# Patient Record
Sex: Male | Born: 1995 | Race: White | Hispanic: Yes | Marital: Single | Smoking: Never smoker
Health system: Southern US, Community
[De-identification: ages and names within clinical notes are randomized; demographics above are authoritative.]

## PROBLEM LIST (undated history)

## (undated) DIAGNOSIS — T7840XA Allergy, unspecified, initial encounter: Secondary | ICD-10-CM

## (undated) HISTORY — DX: Allergy, unspecified, initial encounter: T78.40XA

---

## 2016-09-17 ENCOUNTER — Emergency Department (HOSPITAL_BASED_OUTPATIENT_CLINIC_OR_DEPARTMENT_OTHER)
Admission: EM | Admit: 2016-09-17 | Discharge: 2016-09-17 | Disposition: A | Discharge status: Home / Self Care | Payer: Self-pay | Attending: Emergency Medicine | Admitting: Emergency Medicine

## 2016-09-17 ENCOUNTER — Encounter (HOSPITAL_BASED_OUTPATIENT_CLINIC_OR_DEPARTMENT_OTHER): Payer: Self-pay | Admitting: *Deleted

## 2016-09-17 DIAGNOSIS — R112 Nausea with vomiting, unspecified: Secondary | ICD-10-CM | POA: Insufficient documentation

## 2016-09-17 DIAGNOSIS — R197 Diarrhea, unspecified: Secondary | ICD-10-CM | POA: Insufficient documentation

## 2016-09-17 LAB — URINALYSIS, ROUTINE W REFLEX MICROSCOPIC
GLUCOSE, UA: NEGATIVE mg/dL
HGB URINE DIPSTICK: NEGATIVE
Ketones, ur: NEGATIVE mg/dL
LEUKOCYTES UA: NEGATIVE
Nitrite: NEGATIVE
Protein, ur: NEGATIVE mg/dL
SPECIFIC GRAVITY, URINE: 1.03 (ref 1.005–1.030)
pH: 7.5 (ref 5.0–8.0)

## 2016-09-17 LAB — COMPREHENSIVE METABOLIC PANEL
ALT: 17 U/L (ref 17–63)
ANION GAP: 9 (ref 5–15)
AST: 22 U/L (ref 15–41)
Albumin: 4.7 g/dL (ref 3.5–5.0)
Alkaline Phosphatase: 74 U/L (ref 38–126)
BUN: 20 mg/dL (ref 6–20)
CHLORIDE: 101 mmol/L (ref 101–111)
CO2: 30 mmol/L (ref 22–32)
Calcium: 9.8 mg/dL (ref 8.9–10.3)
Creatinine, Ser: 1.08 mg/dL (ref 0.61–1.24)
GFR calc non Af Amer: >60 mL/min (ref >60)
Glucose, Bld: 108 mg/dL — ABNORMAL HIGH (ref 65–99)
Potassium: 3.9 mmol/L (ref 3.5–5.1)
SODIUM: 140 mmol/L (ref 135–145)
Total Bilirubin: 0.8 mg/dL (ref 0.3–1.2)
Total Protein: 8.4 g/dL — ABNORMAL HIGH (ref 6.5–8.1)

## 2016-09-17 LAB — CBC
HCT: 48.2 % (ref 39.0–52.0)
Hemoglobin: 17.7 g/dL — ABNORMAL HIGH (ref 13.0–17.0)
MCH: 29.5 pg (ref 26.0–34.0)
MCHC: 36.7 g/dL — ABNORMAL HIGH (ref 30.0–36.0)
MCV: 80.3 fL (ref 78.0–100.0)
Platelets: 265 10*3/uL (ref 150–400)
RBC: 6 MIL/uL — AB (ref 4.22–5.81)
RDW: 14.3 % (ref 11.5–15.5)
WBC: 14.3 10*3/uL — ABNORMAL HIGH (ref 4.0–10.5)

## 2016-09-17 LAB — LIPASE, BLOOD: LIPASE: 24 U/L (ref 11–51)

## 2016-09-17 MED ORDER — MORPHINE SULFATE (PF) 4 MG/ML IV SOLN
4.0000 mg | Freq: Once | INTRAVENOUS | Status: AC
  Administered 2016-09-17: 4 mg via INTRAVENOUS
  Filled 2016-09-17: qty 1

## 2016-09-17 MED ORDER — ONDANSETRON HCL 4 MG/2ML IJ SOLN
4.0000 mg | Freq: Once | INTRAMUSCULAR | Status: AC
  Administered 2016-09-17: 4 mg via INTRAVENOUS
  Filled 2016-09-17: qty 2

## 2016-09-17 MED ORDER — SODIUM CHLORIDE 0.9 % IV BOLUS (SEPSIS)
1000.0000 mL | Freq: Once | INTRAVENOUS | Status: AC
  Administered 2016-09-17: 1000 mL via INTRAVENOUS

## 2016-09-17 MED ORDER — ONDANSETRON HCL 4 MG PO TABS: 4.0000 mg | ORAL_TABLET | Freq: Three times a day (TID) | ORAL | 0 refills | Status: DC | PRN

## 2016-09-17 NOTE — ED Provider Notes (Signed)
[facility] Provider [facility]   CSN: [id number] Arrival date & time: 09/17/16  1236     History   Chief Complaint Chief Complaint  Patient presents with  . Emesis    HPI Karl Lopez is a 21 y.o. male.  Patient presents emergency department with chief complaint of nausea, vomiting, diarrhea. He states that the symptoms started last night. He denies any associated fevers or chills. He reports having had exposure to sick contacts. He has not taken anything for symptoms. He denies ever having had abdominal surgery. He denies any dysuria. There are no other associated symptoms or modifying factors.   The history is provided by the patient. The history is limited by a language barrier. A language interpreter was used.    History reviewed. No pertinent past medical history.  There are no active problems to display for this patient.   History reviewed. No pertinent surgical history.     Home Medications    Prior to Admission medications   Not on File    Family History History reviewed. No pertinent family history.  Social History Social History  Substance Use Topics  . Smoking status: Never Smoker  . Smokeless tobacco: Never Used  . Alcohol use No     Allergies   Patient has no allergy information on record.   Review of Systems Review of Systems  All other systems reviewed and are negative.    Physical Exam Updated Vital Signs BP 112/77 (BP Location: Right Arm)   Pulse 120   Temp 99.4 F (37.4 C) (Oral)   Resp 18   Ht 5\' 2"  (1.575 m)   Wt 59 kg   SpO2 96%   BMI 23.78 kg/m   Physical Exam  Constitutional: He is oriented to person, place, and time. He appears well-developed and well-nourished.  HENT:  Head: Normocephalic and atraumatic.  Eyes: Conjunctivae and EOM are normal. Pupils are equal, round, and reactive to light. Right eye exhibits no discharge. Left eye exhibits no discharge. No scleral icterus.  Neck: Normal range of motion.  Neck supple. No JVD present.  Cardiovascular: Normal rate, regular rhythm and normal heart sounds.  Exam reveals no gallop and no friction rub.   No murmur heard. Pulmonary/Chest: Effort normal and breath sounds normal. No respiratory distress. He has no wheezes. He has no rales. He exhibits no tenderness.  Abdominal: Soft. He exhibits no distension and no mass. There is no tenderness. There is no rebound and no guarding.  No focal abdominal tenderness, no RLQ tenderness or pain at McBurney's point, no RUQ tenderness or Murphy's sign, no left-sided abdominal tenderness, no fluid wave, or signs of peritonitis   Musculoskeletal: Normal range of motion. He exhibits no edema or tenderness.  Neurological: He is alert and oriented to person, place, and time.  Skin: Skin is warm and dry.  Psychiatric: He has a normal mood and affect. His behavior is normal. Judgment and thought content normal.  Nursing note and vitals reviewed.    ED Treatments / Results  Labs (all labs ordered are listed, but only abnormal results are displayed) Labs Reviewed  URINALYSIS, ROUTINE W REFLEX MICROSCOPIC - Abnormal; Notable for the following:       Result Value   Bilirubin Urine SMALL (*)    All other components within normal limits  LIPASE, BLOOD  COMPREHENSIVE METABOLIC PANEL  CBC    EKG  EKG Interpretation None       Radiology No results found.  Procedures Procedures (  including critical care time)  Medications Ordered in ED Medications - No data to display   Initial Impression / Assessment and Plan / ED Course  I have reviewed the triage vital signs and the nursing notes.  Pertinent labs & imaging results that were available during my care of the patient were reviewed by me and considered in my medical decision making (see chart for details).     Patient with nausea, vomiting, diarrhea. Onset last night. No focal abdominal tenderness. Doubt acute abdomen. Give fluids, Zofran, and pain  medicine. Will reassess.  3:45 PM Patient reassessed. He states that he feels "great." He has no focal tenderness. And for outpatient treatment with Zofran and follow-up with primary care provider. Return precautions given. He is tolerating oral intake. Discharge to home.  Final Clinical Impressions(s) / ED Diagnoses   Final diagnoses:  Nausea vomiting and diarrhea    New Prescriptions New Prescriptions   ONDANSETRON (ZOFRAN) 4 MG TABLET    Take 1 tablet (4 mg total) by mouth every 8 (eight) hours as needed for nausea or vomiting.     Roxy Horseman, PA-C 09/17/16 1547    Vanetta Mulders, MD 09/18/16 [phone]

## 2016-09-17 NOTE — ED Notes (Signed)
Discharge instructions provided with the help of [employer]. Interpreter [phone] Vladimir CroftsEsmeralda.

## 2016-09-17 NOTE — ED Notes (Signed)
[employer] used. Interpreter Byrd HesselbachMaria [phone]. Pt c/o abd pain that started over night and has woke him from his sleep. Pt describes pain in the RUQ. EDP at bedside.

## 2016-09-17 NOTE — ED Triage Notes (Addendum)
Pt reports that he does not speak english but is following commands and answering questions with broken english without difficulty in triage.  States vomiting, diarrhea and abdominal pain x 1 day.  Unknown fever.  Ambulatory, A/O x 4.  Pt questioning in triage why 'nobody speak spanish'.    pts abdomen non-distended, no tenderness on palpation.

## 2017-12-15 ENCOUNTER — Ambulatory Visit (INDEPENDENT_AMBULATORY_CARE_PROVIDER_SITE_OTHER): Payer: Self-pay | Admitting: Physician Assistant

## 2017-12-15 ENCOUNTER — Other Ambulatory Visit: Payer: Self-pay

## 2017-12-15 ENCOUNTER — Encounter: Payer: Self-pay | Admitting: Physician Assistant

## 2017-12-15 VITALS — BP 108/72 | HR 83 | Temp 97.9°F | Resp 16 | Ht 62.5 in | Wt 124.6 lb

## 2017-12-15 DIAGNOSIS — H60331 Swimmer's ear, right ear: Secondary | ICD-10-CM

## 2017-12-15 MED ORDER — NEOMYCIN-POLYMYXIN-DEXAMETH 3.5-10000-0.1 OP SUSP
OPHTHALMIC | 0 refills | Status: AC
Start: 2017-12-15 — End: ?

## 2017-12-15 NOTE — Patient Instructions (Addendum)
  The drop should cost about 40 bucks.    IF you received an x-ray today, you will receive an invoice from [facility]. Please contact [facility] at [phone] with questions or concerns regarding your invoice.   IF you received labwork today, you will receive an invoice from [place]. Please contact [facility] at [phone] with questions or concerns regarding your invoice.   Our billing staff will not be able to assist you with questions regarding bills from these companies.  You will be contacted with the lab results as soon as they are available. The fastest way to get your results is to activate your My Chart account. Instructions are located on the last page of this paperwork. If you have not heard from [place] regarding the results in 2 weeks, please contact this office.

## 2017-12-15 NOTE — Progress Notes (Signed)
    12/18/2017 9:25 AM   DOB: [birth year] / MRN: [id number]  SUBJECTIVE:  Karl Lopez is a 22 y.o. male presenting for right-sided ear pain and tenderness.  Started 3 days ago and is getting worse.  Patient denies fevers and changes in hearing.  No history of diabetes.  He has No Known Allergies.   He  has a past medical history of Allergy.    He  reports that he has never smoked. He has never used smokeless tobacco. He reports that he does not drink alcohol or use drugs. He  has no sexual activity history on file. The patient  has no past surgical history on file.  His family history is not on file.  Review of Systems  Respiratory: Negative for cough.   Skin: Negative for rash.    The problem list and medications were reviewed and updated by myself where necessary and exist elsewhere in the encounter.   OBJECTIVE:  BP 108/72 (BP Location: Left Arm, Patient Position: Supine, Cuff Size: Normal)   Pulse 83   Temp 97.9 F (36.6 C) (Oral) Comment: 97.9  Resp 16   Ht 5' 2.5" (1.588 m)   Wt 124 lb 9.6 oz (56.5 kg)   SpO2 98%   BMI 22.43 kg/m   Physical Exam  Constitutional: He is oriented to person, place, and time. He appears well-developed. He does not appear ill.  HENT:  Right Ear: There is tenderness. No drainage. No mastoid tenderness. Tympanic membrane is not injected, not erythematous, not retracted and not bulging. No middle ear effusion. No decreased hearing is noted.  Left Ear: No drainage or tenderness.  No middle ear effusion. No decreased hearing is noted.  Eyes: Pupils are equal, round, and reactive to light. Conjunctivae and EOM are normal.  Cardiovascular: Normal rate.  Pulmonary/Chest: Effort normal.  Abdominal: He exhibits no distension.  Musculoskeletal: Normal range of motion.  Neurological: He is alert and oriented to person, place, and time. No cranial nerve deficit. Coordination normal.  Skin: Skin is warm and dry. He is not diaphoretic.  Psychiatric: He  has a normal mood and affect.  Nursing note and vitals reviewed.   No results found for this or any previous visit (from the past 72 hour(s)).  No results found.  ASSESSMENT AND PLAN:  Bertel was seen today for ear pain.  Diagnoses and all orders for this visit:  Acute swimmer's ear of right side: No history of diabetes and no mastoid tenderness today.  I see no indication for p.o. antibiotics.  RTC in 3 to 4 days if symptoms are not improving. -     neomycin-polymyxin b-dexamethasone (MAXITROL) 3.5-10000-0.1 SUSP; 3 drops in the affected ear daily.    The patient is advised to call or return to clinic if he does not see an improvement in symptoms, or to seek the care of the closest emergency department if he worsens with the above plan.   Deliah Boston, MHS, PA-C [facility] at [facility] 12/18/2017 9:25 AM

## 2020-09-18 ENCOUNTER — Encounter (HOSPITAL_COMMUNITY): Payer: Self-pay

## 2020-09-18 ENCOUNTER — Other Ambulatory Visit: Payer: Self-pay

## 2020-09-18 ENCOUNTER — Ambulatory Visit (HOSPITAL_COMMUNITY)
Admission: RE | Admit: 2020-09-18 | Discharge: 2020-09-18 | Disposition: A | Discharge status: Home / Self Care | Payer: Self-pay | Source: Ambulatory Visit | Attending: Urgent Care | Admitting: Urgent Care

## 2020-09-18 ENCOUNTER — Ambulatory Visit (INDEPENDENT_AMBULATORY_CARE_PROVIDER_SITE_OTHER): Payer: Self-pay

## 2020-09-18 VITALS — BP 128/82 | HR 89 | Temp 98.0°F | Resp 18

## 2020-09-18 DIAGNOSIS — R0781 Pleurodynia: Secondary | ICD-10-CM

## 2020-09-18 DIAGNOSIS — R079 Chest pain, unspecified: Secondary | ICD-10-CM

## 2020-09-18 DIAGNOSIS — S161XXA Strain of muscle, fascia and tendon at neck level, initial encounter: Secondary | ICD-10-CM

## 2020-09-18 DIAGNOSIS — M545 Low back pain, unspecified: Secondary | ICD-10-CM

## 2020-09-18 DIAGNOSIS — S39012A Strain of muscle, fascia and tendon of lower back, initial encounter: Secondary | ICD-10-CM

## 2020-09-18 DIAGNOSIS — M542 Cervicalgia: Secondary | ICD-10-CM

## 2020-09-18 MED ORDER — NAPROXEN 500 MG PO TABS: 500.0000 mg | ORAL_TABLET | Freq: Two times a day (BID) | ORAL | 0 refills | Status: AC

## 2020-09-18 MED ORDER — AEROCHAMBER PLUS FLO-VU SMALL MISC
Status: AC
  Filled 2020-09-18: qty 1

## 2020-09-18 MED ORDER — TIZANIDINE HCL 4 MG PO TABS: 4.0000 mg | ORAL_TABLET | Freq: Every day | ORAL | 0 refills | Status: AC
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
## 2022-02-08 ENCOUNTER — Encounter: Payer: Self-pay | Admitting: Physician Assistant

## 2022-03-26 IMAGING — DX DG RIBS W/ CHEST 3+V*L*
5 series · 5 of 5 positions shown · non-contrast
Comparison: None.

CLINICAL DATA: Left side chest pain, rib pain

EXAM:
LEFT RIBS AND CHEST - 3+ VIEW

[chest pa]
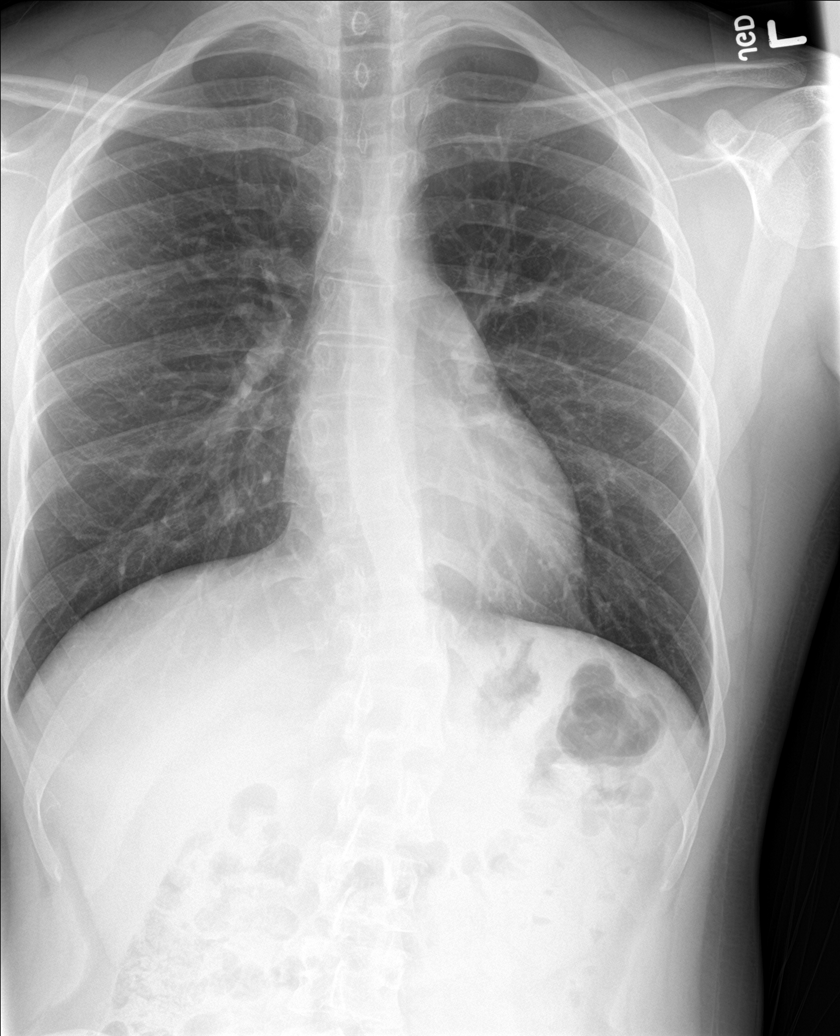

[rib obl (1 of 2)]
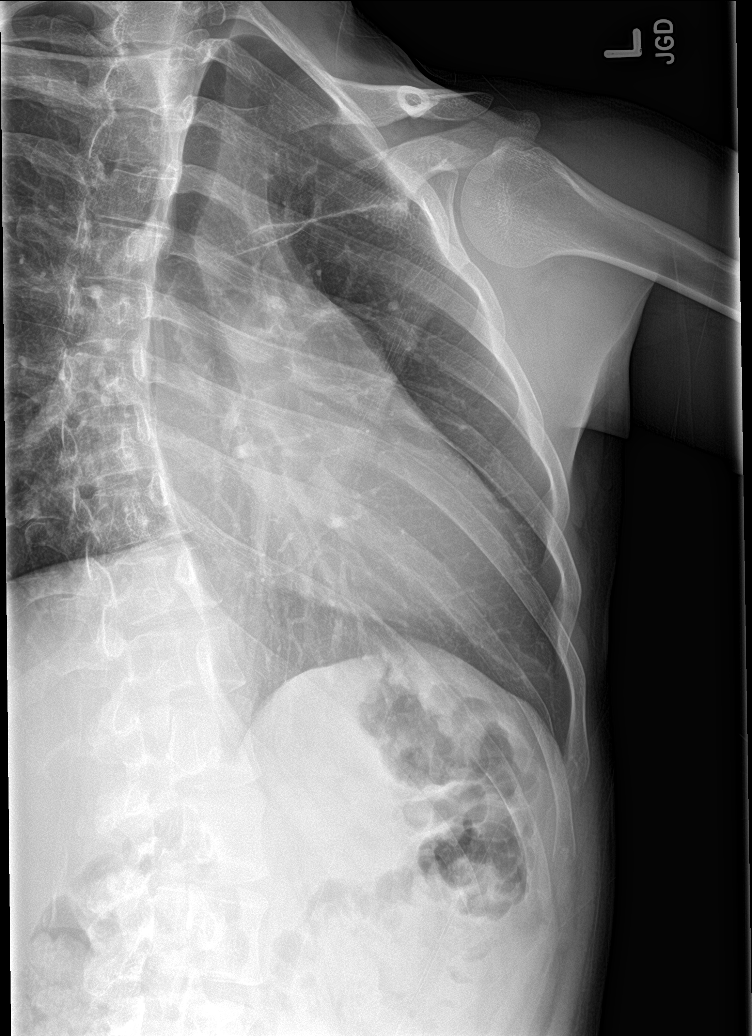

[rib obl (2 of 2)]
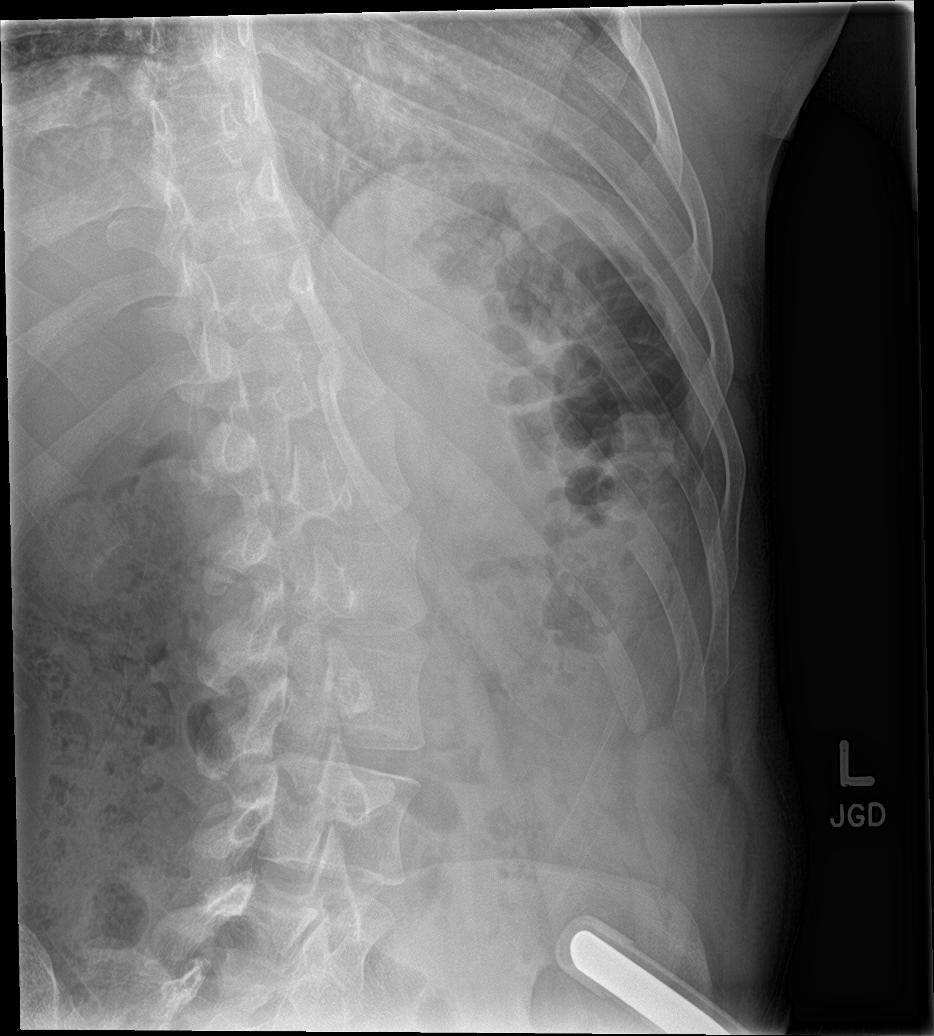

[rib ap]
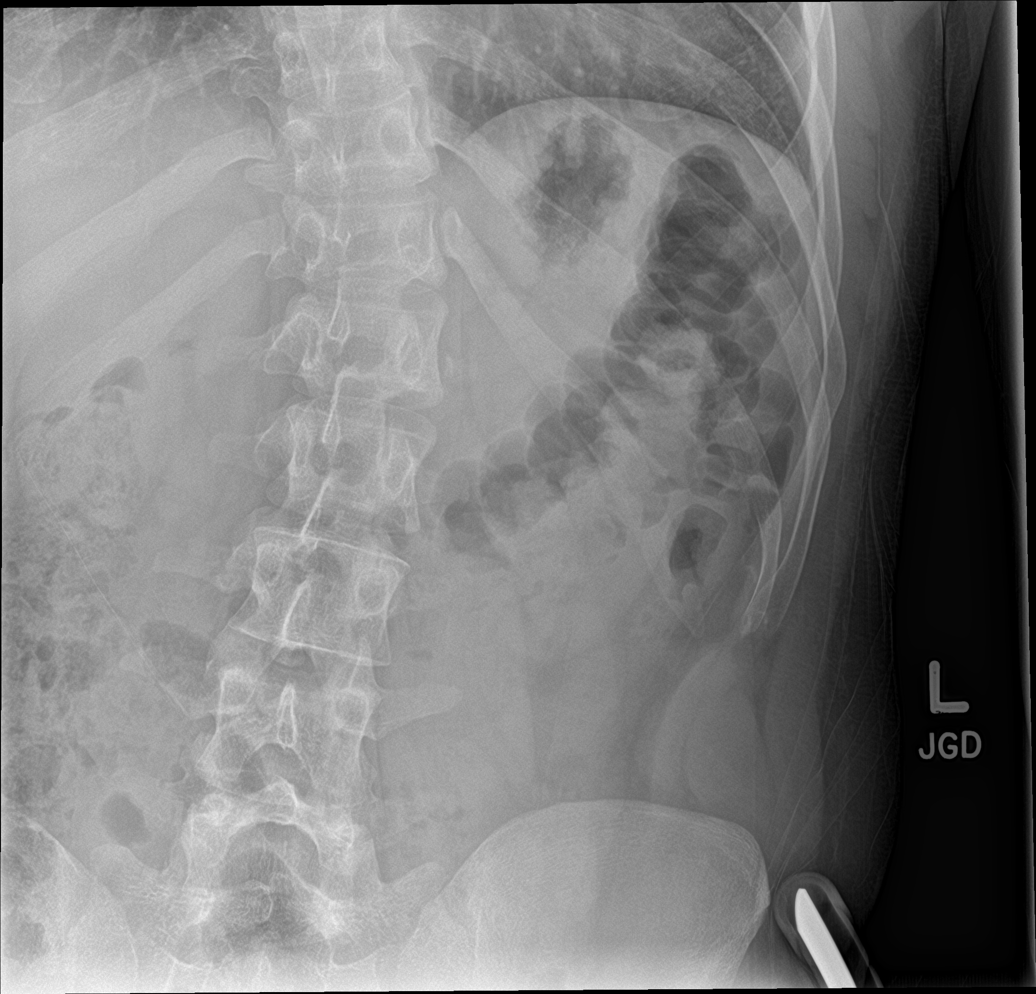

[rib pa]
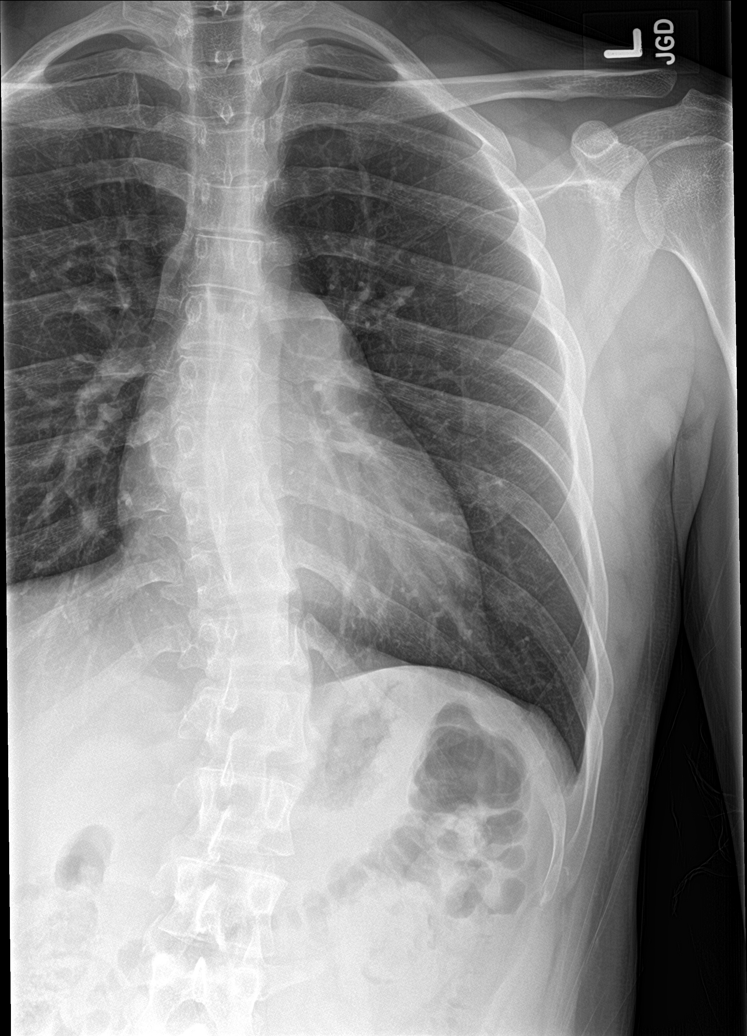

[5 of 5 positions shown; findings below may reference images not displayed]

FINDINGS: No fracture or other bone lesions are seen involving the ribs. There
is no evidence of pneumothorax or pleural effusion. Both lungs are
clear. Heart size and mediastinal contours are within normal limits.
IMPRESSION: Negative.
